# Patient Record
Sex: Female | Born: 1985 | Race: White | Hispanic: No | Marital: Married | Smoking: Current every day smoker
Health system: Southern US, Community
[De-identification: ages and names within clinical notes are randomized; demographics above are authoritative.]

## PROBLEM LIST (undated history)

## (undated) DIAGNOSIS — Z789 Other specified health status: Secondary | ICD-10-CM

---
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
## 2005-02-22 ENCOUNTER — Other Ambulatory Visit: Admission: RE | Admit: 2005-02-22 | Discharge: 2005-02-22 | Payer: Self-pay | Admitting: Obstetrics and Gynecology

## 2006-09-11 ENCOUNTER — Emergency Department (HOSPITAL_COMMUNITY): Admission: EM | Admit: 2006-09-11 | Discharge: 2006-09-11 | Payer: Self-pay | Admitting: Emergency Medicine

## 2006-11-29 ENCOUNTER — Ambulatory Visit (HOSPITAL_COMMUNITY): Admission: RE | Admit: 2006-11-29 | Discharge: 2006-11-29 | Payer: Self-pay | Admitting: Obstetrics and Gynecology

## 2006-12-18 ENCOUNTER — Ambulatory Visit (HOSPITAL_COMMUNITY): Admission: RE | Admit: 2006-12-18 | Discharge: 2006-12-18 | Payer: Self-pay | Admitting: Obstetrics and Gynecology

## 2007-04-07 ENCOUNTER — Inpatient Hospital Stay (HOSPITAL_COMMUNITY): Admission: AD | Admit: 2007-04-07 | Discharge: 2007-04-09 | Payer: Self-pay | Admitting: Obstetrics and Gynecology

## 2007-04-17 ENCOUNTER — Encounter: Admission: RE | Admit: 2007-04-17 | Discharge: 2007-05-16 | Payer: Self-pay | Admitting: Obstetrics and Gynecology

## 2007-05-17 ENCOUNTER — Encounter: Admission: RE | Admit: 2007-05-17 | Discharge: 2007-05-25 | Payer: Self-pay | Admitting: Obstetrics and Gynecology

## 2007-06-01 IMAGING — US US OB FOLLOW-UP
1 series · 14 of 28 positions shown · non-contrast
Comparison: none

OBSTETRICAL ULTRASOUND:
 This ultrasound was performed in The [HOSPITAL], and the [facility] report will be stored to [REDACTED] PACS.

[Series 1: us ob follow-up · 14 of 71 slices shown]
[im 3/71]
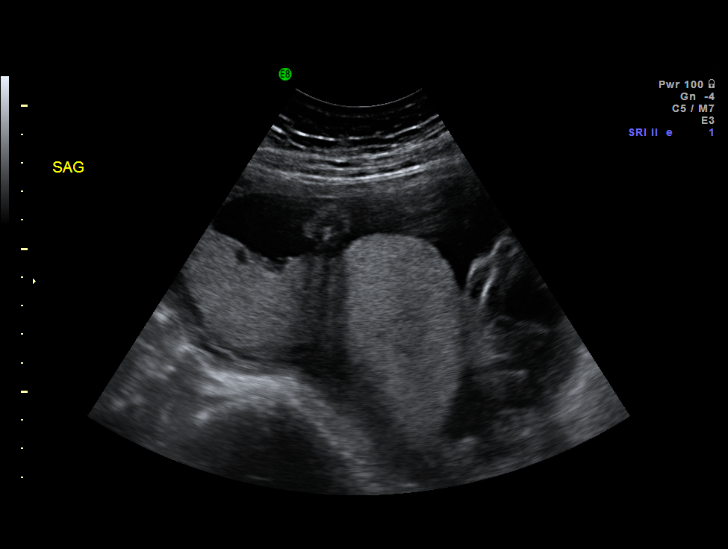
[im 8/71]
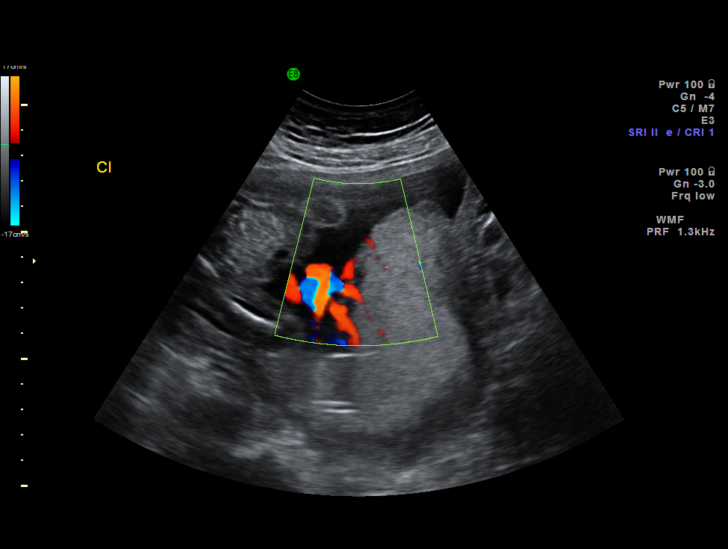
[im 13/71]
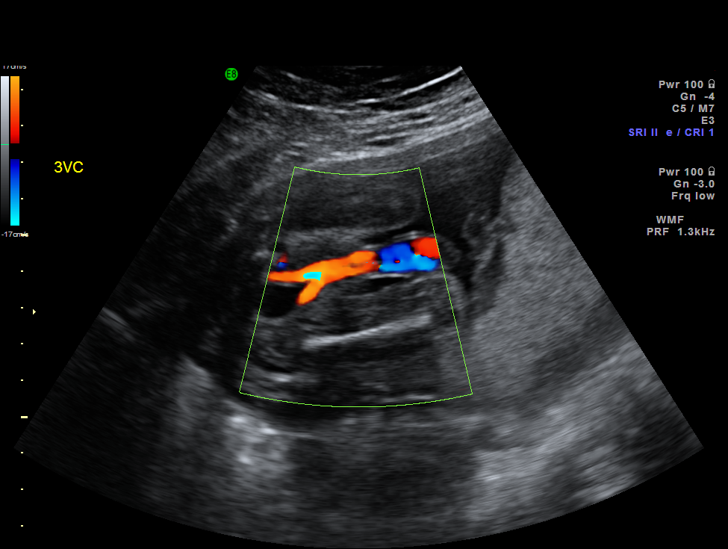
[im 19/71]
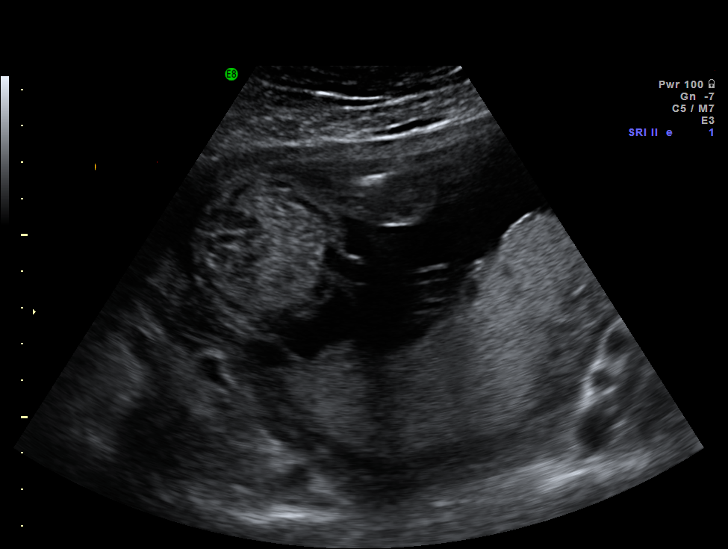
[im 24/71]
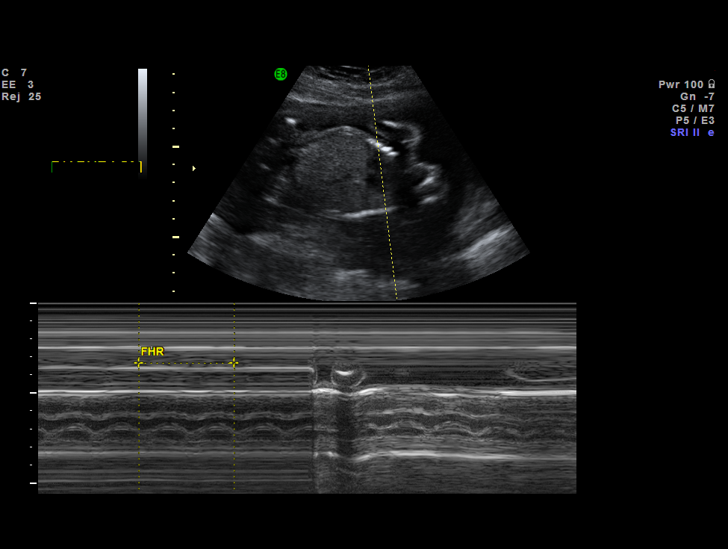
[im 29/71]
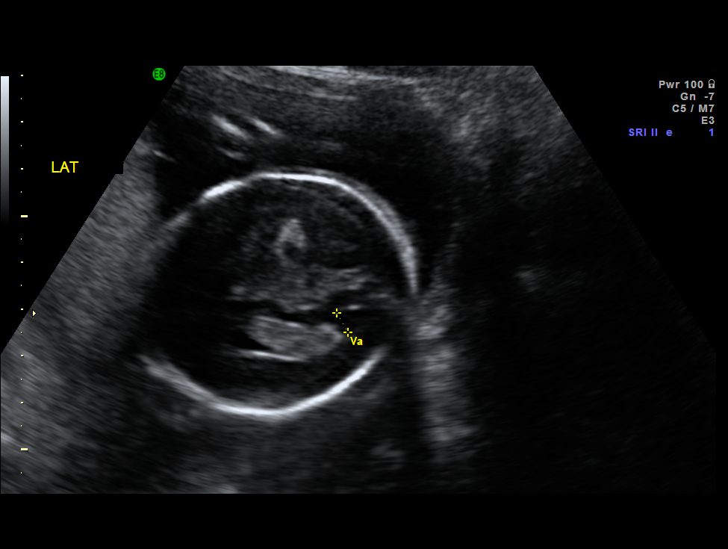
[im 34/71]
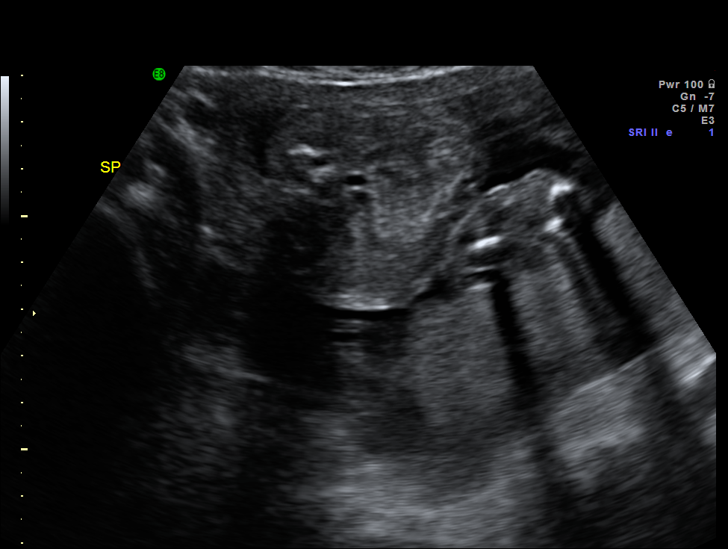
[im 39/71]
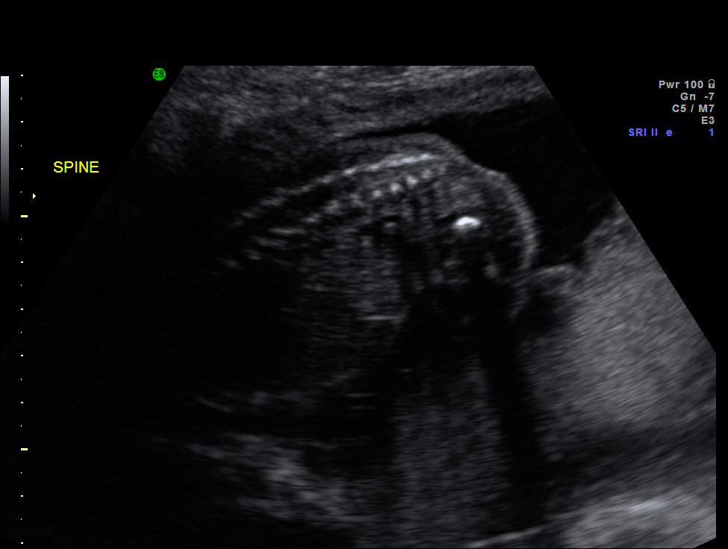
[im 45/71]
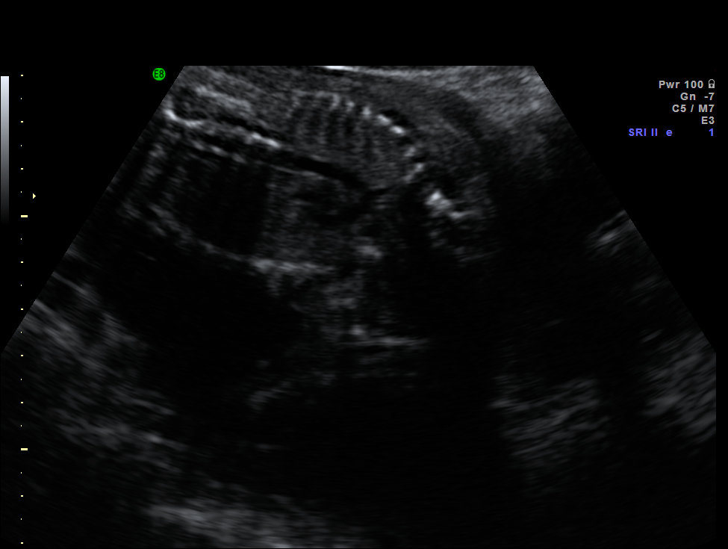
[im 50/71]
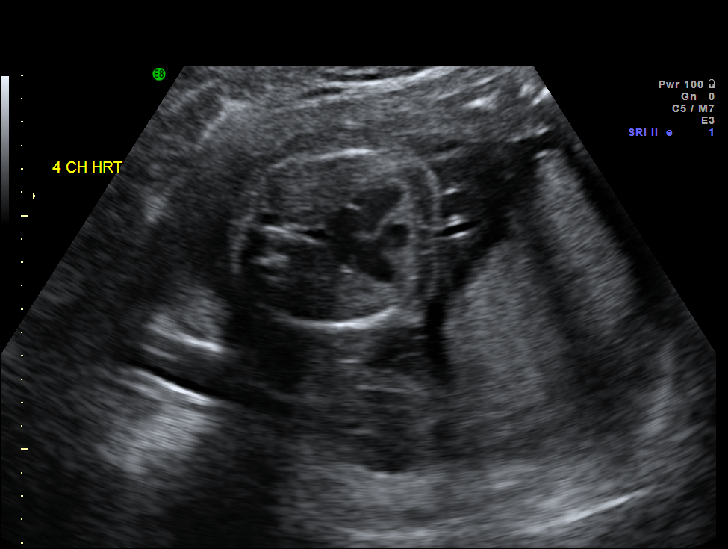
[im 55/71]
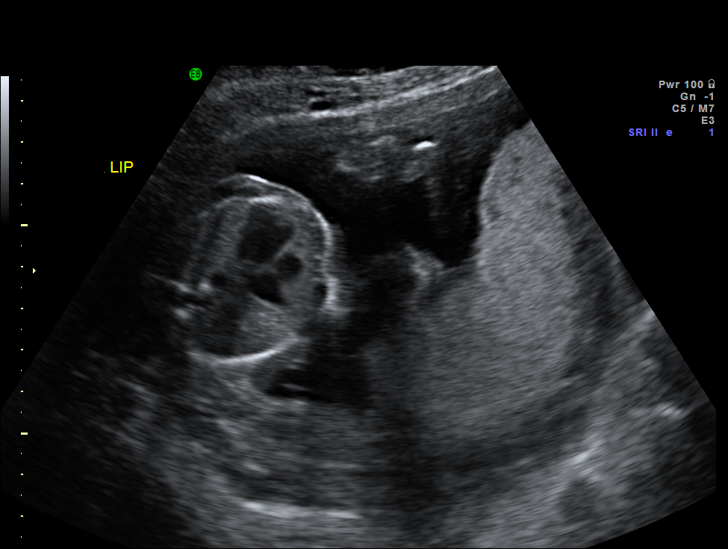
[im 60/71]
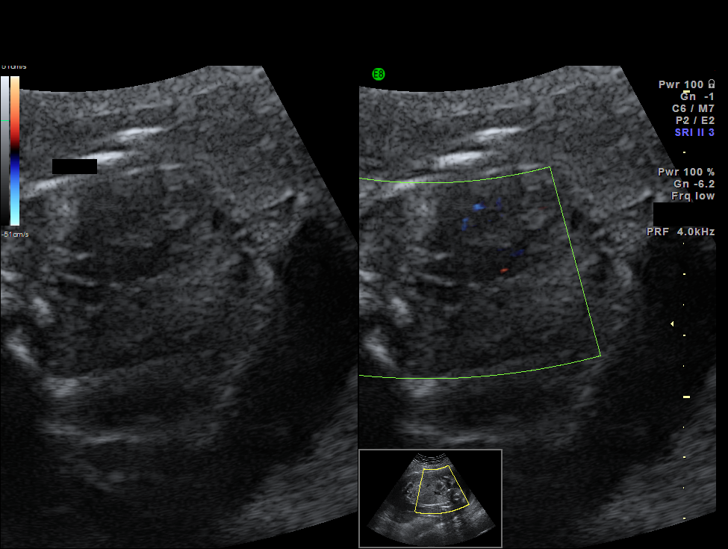
[im 65/71]
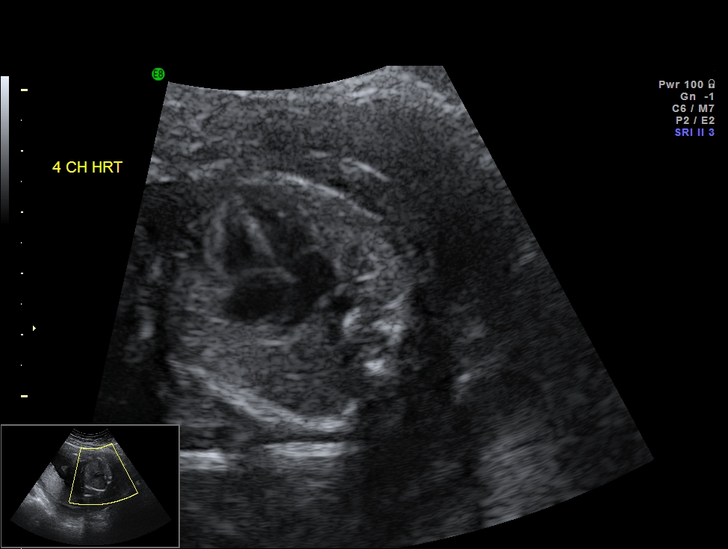
[im 71/71]
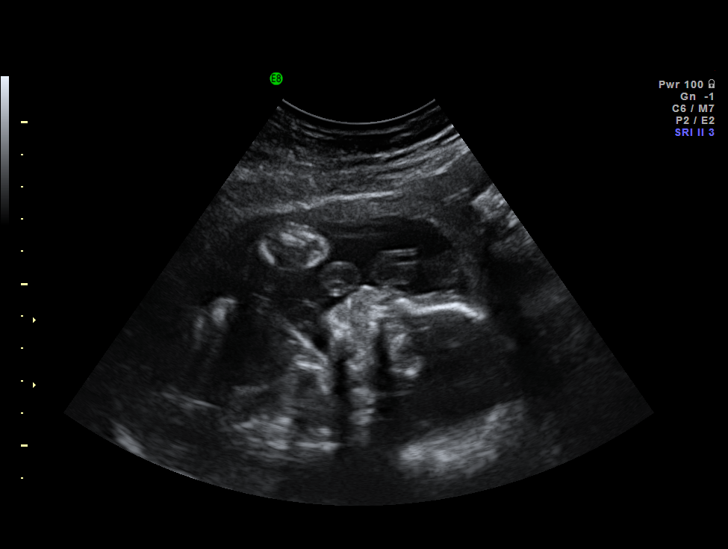

[14 of 28 positions shown; findings below may reference images not displayed]

IMPRESSION: The [facility] report has also been faxed to the ordering physician.

## 2007-06-24 ENCOUNTER — Ambulatory Visit (HOSPITAL_COMMUNITY): Admission: RE | Admit: 2007-06-24 | Discharge: 2007-06-24 | Payer: Self-pay | Admitting: Obstetrics and Gynecology

## 2007-11-25 ENCOUNTER — Ambulatory Visit (HOSPITAL_COMMUNITY): Admission: RE | Admit: 2007-11-25 | Discharge: 2007-11-25 | Payer: Self-pay | Admitting: Obstetrics and Gynecology

## 2007-11-25 ENCOUNTER — Emergency Department (HOSPITAL_COMMUNITY): Admission: EM | Admit: 2007-11-25 | Discharge: 2007-11-25 | Payer: Self-pay | Admitting: Emergency Medicine

## 2008-06-14 ENCOUNTER — Inpatient Hospital Stay (HOSPITAL_COMMUNITY): Admission: AD | Admit: 2008-06-14 | Discharge: 2008-06-16 | Payer: Self-pay | Admitting: Obstetrics and Gynecology

## 2009-08-07 HISTORY — PX: WISDOM TOOTH EXTRACTION: SHX21

## 2010-08-28 ENCOUNTER — Encounter: Payer: Self-pay | Admitting: Obstetrics and Gynecology

## 2011-05-02 LAB — URINE MICROSCOPIC-ADD ON

## 2011-05-02 LAB — POCT PREGNANCY, URINE
Operator id: 146091
Preg Test, Ur: POSITIVE

## 2011-05-02 LAB — POCT I-STAT, CHEM 8
BUN: 8
Calcium, Ion: 1.17
Chloride: 102
Glucose, Bld: 89
Potassium: 3.7
Sodium: 135

## 2011-05-02 LAB — URINALYSIS, ROUTINE W REFLEX MICROSCOPIC
Hgb urine dipstick: NEGATIVE
Ketones, ur: NEGATIVE
Nitrite: NEGATIVE
Protein, ur: NEGATIVE
Urobilinogen, UA: 1
pH: 6

## 2011-05-02 LAB — HCG, QUANTITATIVE, PREGNANCY: hCG, Beta Chain, Quant, S: 114012 — ABNORMAL HIGH

## 2011-05-09 LAB — CBC
HCT: 35.6 — ABNORMAL LOW
MCV: 85.1
MCV: 85.8
Platelets: 151
Platelets: 158
RBC: 3.57 — ABNORMAL LOW
RBC: 4.19
RDW: 12.9

## 2011-05-19 LAB — RPR: RPR Ser Ql: NONREACTIVE

## 2011-05-19 LAB — CBC
HCT: 32.5 — ABNORMAL LOW
HCT: 37.8
MCHC: 34.1
MCV: 86.9
MCV: 85.6
Platelets: 144 — ABNORMAL LOW
RBC: 3.74 — ABNORMAL LOW
RDW: 12.4
WBC: 16.6 — ABNORMAL HIGH

## 2013-09-29 ENCOUNTER — Ambulatory Visit (INDEPENDENT_AMBULATORY_CARE_PROVIDER_SITE_OTHER): Payer: Self-pay | Admitting: Family Medicine

## 2013-09-29 ENCOUNTER — Encounter: Payer: Self-pay | Admitting: Family Medicine

## 2013-09-29 ENCOUNTER — Encounter (INDEPENDENT_AMBULATORY_CARE_PROVIDER_SITE_OTHER): Payer: Self-pay

## 2013-09-29 VITALS — BP 95/62 | HR 88 | Temp 98.4°F | Ht 65.5 in | Wt 139.0 lb

## 2013-09-29 DIAGNOSIS — Z0000 Encounter for general adult medical examination without abnormal findings: Secondary | ICD-10-CM

## 2013-09-29 LAB — POCT CBC
Granulocyte percent: 63.2 %G (ref 37–80)
HCT, POC: 39.9 % (ref 37.7–47.9)
Hemoglobin: 12.5 g/dL (ref 12.2–16.2)
Lymph, poc: 3.1 (ref 0.6–3.4)
MCH, POC: 26.5 pg — AB (ref 27–31.2)
MCHC: 31.3 g/dL — AB (ref 31.8–35.4)
MCV: 84.5 fL (ref 80–97)
MPV: 7.7 fL (ref 0–99.8)
POC Granulocyte: 6.4 (ref 2–6.9)
POC LYMPH PERCENT: 30.9 %L (ref 10–50)
Platelet Count, POC: 198 10*3/uL (ref 142–424)
RBC: 4.7 M/uL (ref 4.04–5.48)
RDW, POC: 14.4 %
WBC: 10.1 10*3/uL (ref 4.6–10.2)

## 2013-09-29 NOTE — Progress Notes (Signed)
   Subjective:    Patient ID: MONIA TIMMERS, female    DOB: 02-Dec-1985, 28 y.o.   MRN: [id number]  HPI This 28 y.o. female presents for evaluation of routine follow up.  She is due for annual labs.  She is scheduled for pap smear.  She does smoke cigarettes.   Review of Systems No chest pain, SOB, HA, dizziness, vision change, N/V, diarrhea, constipation, dysuria, urinary urgency or frequency, myalgias, arthralgias or rash.     Objective:   Physical Exam Vital signs noted  Well developed well nourished female.  HEENT - Head atraumatic Normocephalic                Eyes - PERRLA, Conjuctiva - clear Sclera- Clear EOMI                Ears - EAC's Wnl TM's Wnl Gross Hearing WNL                Nose - Nares patent                 Throat - oropharanx wnl Respiratory - Lungs CTA bilateral Cardiac - RRR S1 and S2 without murmur GI - Abdomen soft Nontender and bowel sounds active x 4 Extremities - No edema. Neuro - Grossly intact.       Assessment & Plan:  Routine general medical examination at a health care facility - Plan: POCT CBC, CMP14+EGFR, Lipid panel, TSH, Vit D  25 hydroxy (rtn osteoporosis monitoring).  Follow up for pap smear  Tobacco abuse - Discussed that she needs to quit and she doesn't want help at this time  Lysbeth Penner FNP

## 2013-09-30 ENCOUNTER — Other Ambulatory Visit: Payer: Self-pay | Admitting: Family Medicine

## 2013-09-30 LAB — CMP14+EGFR
ALT: 5 IU/L (ref 0–32)
AST: 14 IU/L (ref 0–40)
Albumin/Globulin Ratio: 2.1 (ref 1.1–2.5)
Albumin: 4.5 g/dL (ref 3.5–5.5)
Alkaline Phosphatase: 93 IU/L (ref 39–117)
BUN/Creatinine Ratio: 13 (ref 8–20)
BUN: 9 mg/dL (ref 6–20)
CO2: 25 mmol/L (ref 18–29)
Calcium: 9.1 mg/dL (ref 8.7–10.2)
Chloride: 101 mmol/L (ref 97–108)
Creatinine, Ser: 0.7 mg/dL (ref 0.57–1.00)
GFR calc Af Amer: 136 mL/min/{1.73_m2} (ref >59)
GFR calc non Af Amer: 118 mL/min/{1.73_m2} (ref >59)
Globulin, Total: 2.1 g/dL (ref 1.5–4.5)
Glucose: 82 mg/dL (ref 65–99)
Potassium: 4.2 mmol/L (ref 3.5–5.2)
Sodium: 140 mmol/L (ref 134–144)
Total Bilirubin: <0.2 mg/dL (ref 0.0–1.2)
Total Protein: 6.6 g/dL (ref 6.0–8.5)

## 2013-09-30 LAB — LIPID PANEL
Chol/HDL Ratio: 4.5 ratio units — ABNORMAL HIGH (ref 0.0–4.4)
Cholesterol, Total: 180 mg/dL (ref 100–199)
HDL: 40 mg/dL (ref >39)
LDL Calculated: 130 mg/dL — ABNORMAL HIGH (ref 0–99)
Triglycerides: 51 mg/dL (ref 0–149)
VLDL Cholesterol Cal: 10 mg/dL (ref 5–40)

## 2013-09-30 LAB — VITAMIN D 25 HYDROXY (VIT D DEFICIENCY, FRACTURES): Vit D, 25-Hydroxy: 12.8 ng/mL — ABNORMAL LOW (ref 30.0–100.0)

## 2013-09-30 LAB — TSH: TSH: 2.82 u[IU]/mL (ref 0.450–4.500)

## 2013-09-30 MED ORDER — VITAMIN D (ERGOCALCIFEROL) 1.25 MG (50000 UNIT) PO CAPS: 50000.0000 [IU] | ORAL_CAPSULE | ORAL | Status: DC

## 2015-05-20 LAB — OB RESULTS CONSOLE GC/CHLAMYDIA
CHLAMYDIA, DNA PROBE: NEGATIVE
GC PROBE AMP, GENITAL: NEGATIVE

## 2015-06-17 LAB — OB RESULTS CONSOLE ABO/RH: RH Type: POSITIVE

## 2015-06-17 LAB — OB RESULTS CONSOLE HEPATITIS B SURFACE ANTIGEN: HEP B S AG: NEGATIVE

## 2015-06-17 LAB — OB RESULTS CONSOLE ANTIBODY SCREEN: ANTIBODY SCREEN: NEGATIVE

## 2015-06-17 LAB — OB RESULTS CONSOLE HIV ANTIBODY (ROUTINE TESTING): HIV: NONREACTIVE

## 2015-06-17 LAB — OB RESULTS CONSOLE RPR: RPR: NONREACTIVE

## 2015-08-08 NOTE — L&D Delivery Note (Signed)
Delivery Note Pt never felt pressure or pain, but baby was at + 2 station, so she decided to push.  After 3 pushes, At 1:48 AM a viable female was delivered via Vaginal, Spontaneous Delivery (Presentation: Right Occiput Anterior).  APGAR:9/9 , ; weight pending. After 2 minutes, the cord was clamped and cut. 40 units of pitocin diluted in 1000cc LR was infused rapidly IV.  The placenta separated spontaneously and delivered via CCT and maternal pushing effort.  It was inspected and appears to be intact with a 3 VC.   Marland Kitchen.     Anesthesia: Epidural  Episiotomy: None Lacerations:  2bd degree Suture Repair: 2.0 vicryl Est. Blood Loss (mL):  200  Mom to postpartum.  Baby to Couplet care / Skin to Skin.  CRESENZO-DISHMAN,Jahmeir Geisen 12/17/2015, 2:15 AM

## 2015-12-16 ENCOUNTER — Inpatient Hospital Stay (HOSPITAL_COMMUNITY)
Admission: AD | Admit: 2015-12-16 | Discharge: 2015-12-18 | DRG: 775 | Disposition: A | Discharge status: Home / Self Care | Payer: Medicaid Other | Source: Ambulatory Visit | Attending: Obstetrics and Gynecology | Admitting: Obstetrics and Gynecology

## 2015-12-16 ENCOUNTER — Inpatient Hospital Stay (HOSPITAL_COMMUNITY): Payer: Medicaid Other | Admitting: Anesthesiology

## 2015-12-16 ENCOUNTER — Encounter (HOSPITAL_COMMUNITY): Payer: Self-pay | Admitting: *Deleted

## 2015-12-16 DIAGNOSIS — Z833 Family history of diabetes mellitus: Secondary | ICD-10-CM

## 2015-12-16 DIAGNOSIS — Z3A38 38 weeks gestation of pregnancy: Secondary | ICD-10-CM | POA: Diagnosis not present

## 2015-12-16 DIAGNOSIS — O99334 Smoking (tobacco) complicating childbirth: Secondary | ICD-10-CM | POA: Diagnosis present

## 2015-12-16 DIAGNOSIS — F17210 Nicotine dependence, cigarettes, uncomplicated: Secondary | ICD-10-CM | POA: Diagnosis present

## 2015-12-16 DIAGNOSIS — IMO0001 Reserved for inherently not codable concepts without codable children: Secondary | ICD-10-CM

## 2015-12-16 HISTORY — DX: Other specified health status: Z78.9

## 2015-12-16 LAB — URINE MICROSCOPIC-ADD ON
Bacteria, UA: NONE SEEN
RBC / HPF: NONE SEEN RBC/hpf (ref 0–5)
WBC, UA: NONE SEEN WBC/hpf (ref 0–5)

## 2015-12-16 LAB — CBC
HCT: 33.1 % — ABNORMAL LOW (ref 36.0–46.0)
Hemoglobin: 11.2 g/dL — ABNORMAL LOW (ref 12.0–15.0)
MCH: 28.9 pg (ref 26.0–34.0)
MCHC: 33.8 g/dL (ref 30.0–36.0)
MCV: 85.3 fL (ref 78.0–100.0)
PLATELETS: 252 10*3/uL (ref 150–400)
RBC: 3.88 MIL/uL (ref 3.87–5.11)
RDW: 13.6 % (ref 11.5–15.5)
WBC: 13.7 10*3/uL — ABNORMAL HIGH (ref 4.0–10.5)

## 2015-12-16 LAB — URINALYSIS, ROUTINE W REFLEX MICROSCOPIC
BILIRUBIN URINE: NEGATIVE
GLUCOSE, UA: 500 mg/dL — AB
KETONES UR: NEGATIVE mg/dL
LEUKOCYTES UA: NEGATIVE
NITRITE: NEGATIVE
PH: 5.5 (ref 5.0–8.0)
Protein, ur: NEGATIVE mg/dL
SPECIFIC GRAVITY, URINE: 1.01 (ref 1.005–1.030)

## 2015-12-16 LAB — COMPREHENSIVE METABOLIC PANEL
ALT: 11 U/L — AB (ref 14–54)
ANION GAP: 10 (ref 5–15)
AST: 19 U/L (ref 15–41)
Albumin: 2.9 g/dL — ABNORMAL LOW (ref 3.5–5.0)
Alkaline Phosphatase: 184 U/L — ABNORMAL HIGH (ref 38–126)
BUN: 10 mg/dL (ref 6–20)
CHLORIDE: 102 mmol/L (ref 101–111)
CO2: 22 mmol/L (ref 22–32)
CREATININE: 0.61 mg/dL (ref 0.44–1.00)
Calcium: 8.7 mg/dL — ABNORMAL LOW (ref 8.9–10.3)
GFR calc Af Amer: >60 mL/min (ref >60)
Glucose, Bld: 93 mg/dL (ref 65–99)
POTASSIUM: 3.8 mmol/L (ref 3.5–5.1)
Sodium: 134 mmol/L — ABNORMAL LOW (ref 135–145)
Total Bilirubin: 0.3 mg/dL (ref 0.3–1.2)
Total Protein: 6.8 g/dL (ref 6.5–8.1)

## 2015-12-16 LAB — RAPID URINE DRUG SCREEN, HOSP PERFORMED
Amphetamines: NOT DETECTED
BARBITURATES: NOT DETECTED
BENZODIAZEPINES: NOT DETECTED
COCAINE: NOT DETECTED
Opiates: NOT DETECTED
TETRAHYDROCANNABINOL: NOT DETECTED

## 2015-12-16 LAB — PROTEIN / CREATININE RATIO, URINE
CREATININE, URINE: 41 mg/dL
PROTEIN CREATININE RATIO: 0.17 mg/mg{creat} — AB (ref 0.00–0.15)
TOTAL PROTEIN, URINE: 7 mg/dL

## 2015-12-16 LAB — GROUP B STREP BY PCR: Group B strep by PCR: NEGATIVE

## 2015-12-16 LAB — TYPE AND SCREEN
ABO/RH(D): O POS
Antibody Screen: NEGATIVE

## 2015-12-16 LAB — OB RESULTS CONSOLE GBS: STREP GROUP B AG: NEGATIVE

## 2015-12-16 MED ORDER — ONDANSETRON HCL 4 MG/2ML IJ SOLN: 4.0000 mg | Freq: Four times a day (QID) | INTRAMUSCULAR | Status: DC | PRN

## 2015-12-16 MED ORDER — PHENYLEPHRINE 40 MCG/ML (10ML) SYRINGE FOR IV PUSH (FOR BLOOD PRESSURE SUPPORT): 80.0000 ug | PREFILLED_SYRINGE | INTRAVENOUS | Status: DC | PRN

## 2015-12-16 MED ORDER — LIDOCAINE HCL (PF) 1 % IJ SOLN
30.0000 mL | INTRAMUSCULAR | Status: DC | PRN
  Filled 2015-12-16: qty 30

## 2015-12-16 MED ORDER — EPHEDRINE 5 MG/ML INJ
10.0000 mg | INTRAVENOUS | Status: DC | PRN
  Filled 2015-12-16: qty 2

## 2015-12-16 MED ORDER — DIPHENHYDRAMINE HCL 50 MG/ML IJ SOLN: 12.5000 mg | INTRAMUSCULAR | Status: DC | PRN

## 2015-12-16 MED ORDER — LACTATED RINGERS IV SOLN
INTRAVENOUS | Status: DC
  Administered 2015-12-16 (×3): via INTRAVENOUS

## 2015-12-16 MED ORDER — LACTATED RINGERS IV SOLN: 500.0000 mL | Freq: Once | INTRAVENOUS | Status: DC

## 2015-12-16 MED ORDER — EPHEDRINE 5 MG/ML INJ: 10.0000 mg | INTRAVENOUS | Status: DC | PRN

## 2015-12-16 MED ORDER — CITRIC ACID-SODIUM CITRATE 334-500 MG/5ML PO SOLN: 30.0000 mL | ORAL | Status: DC | PRN

## 2015-12-16 MED ORDER — OXYTOCIN 40 UNITS IN LACTATED RINGERS INFUSION - SIMPLE MED
2.5000 [IU]/h | INTRAVENOUS | Status: DC
  Filled 2015-12-16: qty 1000

## 2015-12-16 MED ORDER — OXYCODONE-ACETAMINOPHEN 5-325 MG PO TABS: 2.0000 | ORAL_TABLET | ORAL | Status: DC | PRN

## 2015-12-16 MED ORDER — LIDOCAINE HCL (PF) 1 % IJ SOLN
INTRAMUSCULAR | Status: DC | PRN
  Administered 2015-12-16 (×2): 6 mL via EPIDURAL

## 2015-12-16 MED ORDER — OXYCODONE-ACETAMINOPHEN 5-325 MG PO TABS: 1.0000 | ORAL_TABLET | ORAL | Status: DC | PRN

## 2015-12-16 MED ORDER — PHENYLEPHRINE 40 MCG/ML (10ML) SYRINGE FOR IV PUSH (FOR BLOOD PRESSURE SUPPORT)
80.0000 ug | PREFILLED_SYRINGE | INTRAVENOUS | Status: DC | PRN
  Filled 2015-12-16: qty 10
  Filled 2015-12-16: qty 5

## 2015-12-16 MED ORDER — FENTANYL 2.5 MCG/ML BUPIVACAINE 1/10 % EPIDURAL INFUSION (WH - ANES)
14.0000 mL/h | INTRAMUSCULAR | Status: DC | PRN
Start: 2015-12-16 — End: 2015-12-17
  Administered 2015-12-16 (×2): 14 mL/h via EPIDURAL
  Filled 2015-12-16: qty 125

## 2015-12-16 MED ORDER — LACTATED RINGERS IV SOLN
500.0000 mL | Freq: Once | INTRAVENOUS | Status: AC
  Administered 2015-12-16: 500 mL via INTRAVENOUS

## 2015-12-16 MED ORDER — OXYTOCIN BOLUS FROM INFUSION
500.0000 mL | INTRAVENOUS | Status: DC
  Administered 2015-12-17: 500 mL via INTRAVENOUS

## 2015-12-16 MED ORDER — PHENYLEPHRINE 40 MCG/ML (10ML) SYRINGE FOR IV PUSH (FOR BLOOD PRESSURE SUPPORT)
80.0000 ug | PREFILLED_SYRINGE | INTRAVENOUS | Status: DC | PRN
Start: 2015-12-16 — End: 2015-12-17

## 2015-12-16 MED ORDER — ACETAMINOPHEN 325 MG PO TABS: 650.0000 mg | ORAL_TABLET | ORAL | Status: DC | PRN

## 2015-12-16 MED ORDER — PHENYLEPHRINE 40 MCG/ML (10ML) SYRINGE FOR IV PUSH (FOR BLOOD PRESSURE SUPPORT)
80.0000 ug | PREFILLED_SYRINGE | INTRAVENOUS | Status: DC | PRN
  Filled 2015-12-16: qty 5

## 2015-12-16 MED ORDER — LACTATED RINGERS IV SOLN: 500.0000 mL | INTRAVENOUS | Status: DC | PRN

## 2015-12-16 NOTE — Anesthesia Procedure Notes (Signed)
Epidural Patient location during procedure: OB Start time: 12/16/2015 10:57 PM End time: 12/16/2015 11:11 PM  Staffing Anesthesiologist: Jairo BenJACKSON, Angelli Baruch Performed by: anesthesiologist   Preanesthetic Checklist Completed: patient identified, surgical consent, pre-op evaluation, timeout performed, IV checked, risks and benefits discussed and monitors and equipment checked  Epidural Patient position: sitting Prep: ChloraPrep and site prepped and draped Patient monitoring: heart rate, continuous pulse ox and blood pressure Approach: midline Location: L3-L4 Injection technique: LOR air  Needle:  Needle type: Tuohy  Needle gauge: 17 G Needle length: 9 cm Needle insertion depth: 7 cm Catheter type: closed end flexible Catheter size: 19 Gauge Catheter at skin depth: 12 cm Test dose: negative and Other (1% lidocaine)  Additional Notes LOR 7cm. Cath 12cm skin  Pt identified in Labor room.  Monitors applied. Working IV access confirmed. Sterile prep, drape lumbar spine.  1% lido local L 3,4.  #17ga Touhy LOR air 7 cm, cath in easily 6 cm, pulled back to 12 cm skin.  1% lido test OK, dosed, and infusion begun.  Patient asymptomatic, VSS, no heme aspirated, tolerated well.  Sandford Craze Demetric Parslow, MD Reason for block:procedure for pain

## 2015-12-16 NOTE — H&P (Signed)
Angelica Duncan is a 30 y.o. female [phone] @[redacted]w[redacted]d  by LMP c/w early US presenting for pelvic pressure and concerns that she has not been in prenatal care since 20 weeks. She [facility] but lost her insurance so was unable to continue care.  She has hx of 3 previous NSVDs at term and no hx with this or previous pregnancies of GDM or HTN.  She reports good fetal movement, denies LOF, vaginal bleeding, vaginal itching/burning, urinary symptoms, h/a, dizziness, n/v, or fever/chills.    Records available and printed by [facility], need to be scanned into electronic record.  Maternal Medical History:  Reason for admission: Contractions.  Nausea.  Contractions: Onset was more than 2 days ago.   Frequency: irregular.   Perceived severity is moderate.    Fetal activity: Perceived fetal activity is normal.   Last perceived fetal movement was within the past hour.    Prenatal complications: Limited prenatal care  Prenatal Complications - Diabetes: none.    OB History    Gravida Para Term Preterm AB TAB SAB Ectopic Multiple Living   4 3 3       3      Past Medical History  Diagnosis Date  . Medical history non-contributory    Past Surgical History  Procedure Laterality Date  . Wisdom tooth extraction  2011   Family History: family history includes Diabetes in her mother; Hyperlipidemia in her mother. Social History:  reports that she has been smoking Cigarettes.  She started smoking about 7 years ago. She has been smoking about 0.50 packs per day. She does not have any smokeless tobacco history on file. She reports that she does not drink alcohol or use illicit drugs.   Prenatal Transfer Tool  Maternal Diabetes: No Genetic Screening: Normal Maternal Ultrasounds/Referrals: Normal Fetal Ultrasounds or other Referrals:  None Maternal Substance Abuse:  No Significant Maternal Medications:  None Significant Maternal Lab Results:  Lab values  include: Other:  Other Comments:  GBS Unknown, rapid test negative on admission  Review of Systems  Constitutional: Negative for fever, chills and malaise/fatigue.  Eyes: Negative for blurred vision.  Respiratory: Negative for cough and shortness of breath.   Cardiovascular: Negative for chest pain.  Gastrointestinal: Negative for heartburn, nausea and vomiting.  Genitourinary: Negative for dysuria, urgency and frequency.  Musculoskeletal: Negative.   Neurological: Negative for dizziness and headaches.  Psychiatric/Behavioral: Negative for depression.    Dilation: 6 Effacement (%): 80 Station: -2 Exam by:: L. Leftwich-Kirbby, CNM Blood pressure 122/81, pulse 103, temperature 98.2 F (36.8 C), temperature source Oral, resp. rate 18, height 5\' 6"  (1.676 m), weight 182 lb (82.555 kg), last menstrual period 03/22/2015. Maternal Exam:  Uterine Assessment: Contraction strength is mild.  Contraction duration is 80 seconds. Contraction frequency is irregular.   Abdomen: Fetal presentation: vertex  Pelvis: adequate for delivery.   Cervix: Cervix evaluated by digital exam.     Fetal Exam Fetal Monitor Review: Mode: ultrasound.   Baseline rate: 135.  Pattern: accelerations present.    Fetal State Assessment: Category I - tracings are normal.     Physical Exam  Nursing note and vitals reviewed. Constitutional: She is oriented to person, place, and time. She appears well-developed and well-nourished.  Neck: Normal range of motion.  Cardiovascular: Normal rate and regular rhythm.   Respiratory: Effort normal and breath sounds normal.  GI: Soft.  Musculoskeletal: Normal range of motion.  Neurological: She is alert and oriented to person,  place, and time.  Skin: Skin is warm and dry.  Psychiatric: She has a normal mood and affect. Her behavior is normal. Judgment and thought content normal.    Prenatal labs: ABO, Rh:   Antibody:   Rubella:   RPR:    HBsAg:    HIV:    GBS:  Negative (05/11 0000)   Assessment/Plan: [id number]  by LMP/early [place] 1. Active labor at term     Admit to [facility] May have epidural when desired Anticipate NSVD  LEFTWICH-KIRBY, Baylynn Shifflett 12/16/2015, 8:01 PM

## 2015-12-16 NOTE — MAU Note (Signed)
Pt stated she has not been to the doctor since she was [redacted] weeks pregnant.Lost her insurance. Has medacaid now. Stated she is worried since she i 4039 week she has had all her kids around 37 week. Stated she has been feeling pressure and the "baby pushing" dow off and on for the past 2 weeks. Good fetal movement reported.

## 2015-12-16 NOTE — Anesthesia Preprocedure Evaluation (Addendum)
Anesthesia Evaluation  Patient identified by MRN, date of birth, ID band Patient awake    Reviewed: Allergy & Precautions, NPO status , Patient's Chart, lab work & pertinent test results  History of Anesthesia Complications Negative for: history of anesthetic complications  Airway Mallampati: II  TM Distance: >3 FB Neck ROM: Full    Dental  (+) Chipped, Dental Advisory Given   Pulmonary Current Smoker,    breath sounds clear to auscultation       Cardiovascular negative cardio ROS   Rhythm:Regular Rate:Normal     Neuro/Psych negative neurological ROS     GI/Hepatic negative GI ROS, Neg liver ROS,   Endo/Other  negative endocrine ROS  Renal/GU negative Renal ROS     Musculoskeletal   Abdominal   Peds  Hematology plt 252K    Anesthesia Other Findings   Reproductive/Obstetrics (+) Pregnancy                            Anesthesia Physical Anesthesia Plan  ASA: II  Anesthesia Plan: Epidural   Post-op Pain Management:    Induction:   Airway Management Planned: Natural Airway  Additional Equipment:   Intra-op Plan:   Post-operative Plan:   Informed Consent: I have reviewed the patients History and Physical, chart, labs and discussed the procedure including the risks, benefits and alternatives for the proposed anesthesia with the patient or authorized representative who has indicated his/her understanding and acceptance.   Dental advisory given  Plan Discussed with: CRNA and Surgeon  Anesthesia Plan Comments: (Patient identified. Risks/Benefits/Options discussed with patient including but not limited to bleeding, infection, nerve damage, paralysis, failed block, incomplete pain control, headache, blood pressure changes, nausea, vomiting, reactions to medication both or allergic, itching and postpartum back pain. Confirmed with bedside nurse the patient's most recent platelet count.  Confirmed with patient that they are not currently taking any anticoagulation, have any bleeding history or any family history of bleeding disorders. Patient expressed understanding and wished to proceed. All questions were answered.  )        Anesthesia Quick Evaluation

## 2015-12-17 ENCOUNTER — Encounter (HOSPITAL_COMMUNITY): Payer: Self-pay | Admitting: *Deleted

## 2015-12-17 DIAGNOSIS — Z3A38 38 weeks gestation of pregnancy: Secondary | ICD-10-CM

## 2015-12-17 DIAGNOSIS — O99334 Smoking (tobacco) complicating childbirth: Secondary | ICD-10-CM

## 2015-12-17 DIAGNOSIS — Z833 Family history of diabetes mellitus: Secondary | ICD-10-CM

## 2015-12-17 LAB — RPR: RPR Ser Ql: NONREACTIVE

## 2015-12-17 LAB — GC/CHLAMYDIA PROBE AMP (~~LOC~~) NOT AT ARMC
Chlamydia: NEGATIVE
Neisseria Gonorrhea: NEGATIVE

## 2015-12-17 LAB — HIV ANTIBODY (ROUTINE TESTING W REFLEX): HIV Screen 4th Generation wRfx: NONREACTIVE

## 2015-12-17 LAB — ABO/RH: ABO/RH(D): O POS

## 2015-12-17 MED ORDER — OXYCODONE HCL 5 MG PO TABS
10.0000 mg | ORAL_TABLET | ORAL | Status: DC | PRN
Start: 2015-12-17 — End: 2015-12-18

## 2015-12-17 MED ORDER — ACETAMINOPHEN 325 MG PO TABS: 650.0000 mg | ORAL_TABLET | ORAL | Status: DC | PRN

## 2015-12-17 MED ORDER — BISACODYL 10 MG RE SUPP: 10.0000 mg | Freq: Every day | RECTAL | Status: DC | PRN

## 2015-12-17 MED ORDER — SIMETHICONE 80 MG PO CHEW: 80.0000 mg | CHEWABLE_TABLET | ORAL | Status: DC | PRN

## 2015-12-17 MED ORDER — DIBUCAINE 1 % RE OINT: 1.0000 "application " | TOPICAL_OINTMENT | RECTAL | Status: DC | PRN

## 2015-12-17 MED ORDER — ONDANSETRON HCL 4 MG PO TABS: 4.0000 mg | ORAL_TABLET | ORAL | Status: DC | PRN

## 2015-12-17 MED ORDER — IBUPROFEN 600 MG PO TABS
600.0000 mg | ORAL_TABLET | Freq: Four times a day (QID) | ORAL | Status: DC
  Administered 2015-12-17 – 2015-12-18 (×4): 600 mg via ORAL
  Filled 2015-12-17 (×4): qty 1

## 2015-12-17 MED ORDER — WITCH HAZEL-GLYCERIN EX PADS: 1.0000 "application " | MEDICATED_PAD | CUTANEOUS | Status: DC | PRN

## 2015-12-17 MED ORDER — TETANUS-DIPHTH-ACELL PERTUSSIS 5-2.5-18.5 LF-MCG/0.5 IM SUSP
0.5000 mL | Freq: Once | INTRAMUSCULAR | Status: AC
  Administered 2015-12-18: 0.5 mL via INTRAMUSCULAR

## 2015-12-17 MED ORDER — ONDANSETRON HCL 4 MG/2ML IJ SOLN: 4.0000 mg | INTRAMUSCULAR | Status: DC | PRN

## 2015-12-17 MED ORDER — COCONUT OIL OIL
1.0000 "application " | TOPICAL_OIL | Status: DC | PRN
  Filled 2015-12-17: qty 120

## 2015-12-17 MED ORDER — BENZOCAINE-MENTHOL 20-0.5 % EX AERO: 1.0000 "application " | INHALATION_SPRAY | CUTANEOUS | Status: DC | PRN

## 2015-12-17 MED ORDER — FLEET ENEMA 7-19 GM/118ML RE ENEM: 1.0000 | ENEMA | Freq: Every day | RECTAL | Status: DC | PRN

## 2015-12-17 MED ORDER — MEASLES, MUMPS & RUBELLA VAC ~~LOC~~ INJ
0.5000 mL | INJECTION | Freq: Once | SUBCUTANEOUS | Status: DC
  Filled 2015-12-17: qty 0.5

## 2015-12-17 MED ORDER — DOCUSATE SODIUM 100 MG PO CAPS
100.0000 mg | ORAL_CAPSULE | Freq: Two times a day (BID) | ORAL | Status: DC
  Administered 2015-12-17 – 2015-12-18 (×2): 100 mg via ORAL
  Filled 2015-12-17 (×2): qty 1

## 2015-12-17 MED ORDER — OXYCODONE HCL 5 MG PO TABS: 5.0000 mg | ORAL_TABLET | ORAL | Status: DC | PRN

## 2015-12-17 MED ORDER — PRENATAL MULTIVITAMIN CH
1.0000 | ORAL_TABLET | Freq: Every day | ORAL | Status: DC
  Administered 2015-12-17 – 2015-12-18 (×2): 1 via ORAL
  Filled 2015-12-17 (×2): qty 1

## 2015-12-17 MED ORDER — DIPHENHYDRAMINE HCL 25 MG PO CAPS
25.0000 mg | ORAL_CAPSULE | Freq: Four times a day (QID) | ORAL | Status: DC | PRN
Start: 2015-12-17 — End: 2015-12-18

## 2015-12-17 MED ORDER — METHYLERGONOVINE MALEATE 0.2 MG/ML IJ SOLN: 0.2000 mg | INTRAMUSCULAR | Status: DC | PRN

## 2015-12-17 MED ORDER — FERROUS SULFATE 325 (65 FE) MG PO TABS
325.0000 mg | ORAL_TABLET | Freq: Two times a day (BID) | ORAL | Status: DC
  Administered 2015-12-17 – 2015-12-18 (×3): 325 mg via ORAL
  Filled 2015-12-17 (×3): qty 1

## 2015-12-17 MED ORDER — METHYLERGONOVINE MALEATE 0.2 MG PO TABS: 0.2000 mg | ORAL_TABLET | ORAL | Status: DC | PRN

## 2015-12-17 MED ORDER — ZOLPIDEM TARTRATE 5 MG PO TABS
5.0000 mg | ORAL_TABLET | Freq: Every evening | ORAL | Status: DC | PRN
Start: 2015-12-17 — End: 2015-12-18

## 2015-12-17 NOTE — Progress Notes (Signed)
CSW acknowledges consult for limited PNC.  CSW notes documentation stating that MOB had regular care until 20 weeks when she lost her insurance, which does not meet criteria for automatic CSW consult.  Please call CSW if current concerns arise or by MOB's request.    

## 2015-12-17 NOTE — Lactation Note (Signed)
This note was copied from a baby's chart. Lactation Consultation Note  Patient Name: Angelica Duncan Today's Date: 12/17/2015 Reason for consult: Initial assessment Baby at 16 hr of life and mom reports baby is latching better than any of her other children. She stated the baby was pinching at each feedings but that has gotten better. Discussed baby behavior, feeding frequency, baby belly size, voids, wt loss, breast changes, and nipple care. Demonstrated manual expression, colostrum noted bilaterally, spoon in room. Given lactation handouts. Aware of OP services and support group.    Maternal Data Has patient been taught Hand Expression?: Yes Does the patient have breastfeeding experience prior to this delivery?: No  Feeding    LATCH Score/Interventions                      Lactation Tools Discussed/Used WIC Program: No   Consult Status Consult Status: Follow-up Date: 12/18/15 Follow-up type: In-patient    [name] 12/17/2015, 5:55 PM

## 2015-12-17 NOTE — Progress Notes (Addendum)
Angelica Duncan is a 30 y.o. [phone] at 8485w0d by LMP admitted for SOL.   Subjective: Patient is very comfortable with epidural, no complaints and not feeling urge to push.   Objective: BP 131/69 mmHg  Pulse 110  Temp(Src) 98.4 F (36.9 C) (Oral)  Resp 16  Ht 5\' 6"  (1.676 m)  Wt 82.555 kg (182 lb)  BMI 29.39 kg/m2  SpO2 99%  LMP 03/22/2015 (Approximate)      FHT:  FHR: 130 bpm, variability: moderate,  accelerations:  Present,  decelerations:  Absent UC:   Moderate, q3-5 min SVE:   Dilation: 9 Effacement (%): 90 Station: -1 Exam by:: Angelica Duncan, [place] JYNW@AROM@ 0005, clear  Labs: Lab Results  Component Value Date   WBC 13.7* 12/16/2015   HGB 11.2* 12/16/2015   HCT 33.1* 12/16/2015   MCV 85.3 12/16/2015   PLT 252 12/16/2015    Assessment / Plan: Spontaneous labor, progressing normally  Labor: SOL, SVE: 9/90/-1. AROM @0005 , clear. Expectant management Fetal Wellbeing:  Category I Pain Control:  Epidural I/D:  GBS neg Anticipated MOD:  SVD  Angelica Duncan 12/17/2015, 12:54 AM   Angelica Duncan,Angelica Duncan 12/21/2015 1:44 PM

## 2015-12-17 NOTE — Anesthesia Postprocedure Evaluation (Signed)
Anesthesia Post Note  Patient: Angelica Duncan  Procedure(s) Performed: * No procedures listed *  Patient location during evaluation: Mother Baby Anesthesia Type: Epidural Level of consciousness: awake and alert and oriented Pain management: pain level controlled Vital Signs Assessment: post-procedure vital signs reviewed and stable Respiratory status: spontaneous breathing Cardiovascular status: blood pressure returned to baseline Postop Assessment: no headache, no backache, epidural receding, patient able to bend at knees, no signs of nausea or vomiting and adequate PO intake Anesthetic complications: no     Last Vitals:  Filed Vitals:   12/17/15 0440 12/17/15 0830  BP: 121/61 116/63  Pulse: 88 88  Temp: 36.9 C 36.6 C  Resp: 18 20    Last Pain:  Filed Vitals:   12/17/15 1001  PainSc: 0-No pain   Pain Goal: Patients Stated Pain Goal: 0 (12/16/15 1617)               [name]

## 2015-12-18 MED ORDER — FERROUS SULFATE 325 (65 FE) MG PO TABS: 325.0000 mg | ORAL_TABLET | Freq: Two times a day (BID) | ORAL | Status: AC

## 2015-12-18 MED ORDER — DOCUSATE SODIUM 100 MG PO CAPS: 100.0000 mg | ORAL_CAPSULE | Freq: Two times a day (BID) | ORAL | Status: AC

## 2015-12-18 MED ORDER — IBUPROFEN 600 MG PO TABS: 600.0000 mg | ORAL_TABLET | Freq: Four times a day (QID) | ORAL | Status: AC

## 2015-12-18 NOTE — Progress Notes (Signed)
Post Partum Day 1 Subjective:  Angelica Duncan is a 30 y.o. G4P4001 52w0ds/p SVD.  No acute events overnight.  Pt denies problems with ambulating, voiding or po intake.  She denies nausea or vomiting.  Pain is well-controlled.  She has had flatus. She has not had bowel movement.  Lochia moderate.  Plan for birth control is condoms.  Method of Feeding:  breast  Objective: Blood pressure 104/62, pulse 67, temperature 98.1 F (36.7 C), temperature source Oral, resp. rate 18, height '5\' 6"'$  (1.676 m), weight 82.555 kg (182 lb), last menstrual period 03/22/2015, SpO2 98 %, unknown if currently breastfeeding.  Physical Exam:  General: alert, cooperative and no distress Lochia:normal flow Chest: CTAB Heart: RRR no m/r/g Abdomen: +BS, soft, nontender Uterine Fundus: firm, below level of umbilicus DVT Evaluation: No evidence of DVT seen on physical exam. Extremities: No edema   Recent Labs  12/16/15 1827  HGB 11.2*  HCT 33.1*    Assessment/Plan:  ASSESSMENT: PROBYN GALATIis a 30y.o. G4P4001 366w0d/p SVD PLAN: Discharge home  MMR before d/c    LOS: 2 days   CaPage Spiro/13/2017, 7:11 AM   OB fellow attestation:  I have seen and examined this patient; I agree with above documentation in the medical student's note.  See Discharge summary for official documentation  KiCaren MacadamMD 10:08 AM

## 2015-12-18 NOTE — Discharge Summary (Signed)
OB Discharge Summary     Patient Name: Angelica Duncan DOB: [birth year] MRN: [id number]  Date of admission: 12/16/2015 Delivering MD: Jacklyn Shell   Date of discharge: 12/18/2015  Admitting diagnosis: 38w check baby status Intrauterine pregnancy: [redacted]w[redacted]d     Secondary diagnosis:  Active Problems:   Active labor at term   NSVD (normal spontaneous vaginal delivery)  Additional problems: None     Discharge diagnosis: Term Pregnancy Delivered                                                                                                Post partum procedures:none  Augmentation: NA  Complications: None  Hospital course:  Onset of Labor With Vaginal Delivery     30 y.o. yo G4P4001 at [redacted]w[redacted]d was admitted in Active Labor on 12/16/2015. Patient had an uncomplicated labor course as follows:  Membrane Rupture Time/Date: 12:05 AM ,12/17/2015   Intrapartum Procedures: Episiotomy: None [1]                                         Lacerations:  2nd degree [3]  Patient had a delivery of a Viable infant. 12/17/2015  Information for the patient's newborn:  Blanche, Scovell [[id number]]  Delivery Method: Vaginal, Spontaneous Delivery (Filed from Delivery Summary)    Pateint had an uncomplicated postpartum course.  She is ambulating, tolerating a regular diet, passing flatus, and urinating well. Patient is discharged home in stable condition on 12/18/2015.    Physical exam  Filed Vitals:   12/17/15 0830 12/17/15 1600 12/17/15 1833 12/18/15 0506  BP: 116/63 110/57 111/50 104/62  Pulse: 88 76 73 67  Temp: 97.8 F (36.6 C) 98 F (36.7 C) 97.8 F (36.6 C) 98.1 F (36.7 C)  TempSrc: Oral Oral Oral Oral  Resp: Height:      Weight:      SpO2: 98%  98%    General: alert, cooperative and no distress Lochia: appropriate Uterine Fundus: firm Incision: Healing well with no significant drainage, No significant erythema, Dressing is clean, dry, and intact DVT  Evaluation: No evidence of DVT seen on physical exam. Negative Homan's sign. No cords or calf tenderness. Labs: Lab Results  Component Value Date   WBC 13.7* 12/16/2015   HGB 11.2* 12/16/2015   HCT 33.1* 12/16/2015   MCV 85.3 12/16/2015   PLT 252 12/16/2015   CMP Latest Ref Rng 12/16/2015  Glucose 65 - 99 mg/dL 93  BUN 6 - 20 mg/dL 10  Creatinine 0.86 - 5.78 mg/dL 4.69  Sodium 629 - 528 mmol/L 134(L)  Potassium 3.5 - 5.1 mmol/L 3.8  Chloride 101 - 111 mmol/L 102  CO2 22 - 32 mmol/L 22  Calcium 8.9 - 10.3 mg/dL 4.1(L)  Total Protein 6.5 - 8.1 g/dL 6.8  Total Bilirubin 0.3 - 1.2 mg/dL 0.3  Alkaline Phos 38 - 126 U/L 184(H)  AST 15 - 41 U/L 19  ALT 14 - 54 U/L 11(L)  Discharge instruction: per After Visit Summary and "Baby and Me Booklet".  After visit meds:    Medication List    TAKE these medications        acetaminophen 325 MG tablet  Commonly known as:  TYLENOL  Take 650 mg by mouth every 6 (six) hours as needed for headache.     docusate sodium 100 MG capsule  Commonly known as:  COLACE  Take 1 capsule (100 mg total) by mouth 2 (two) times daily.     ferrous sulfate 325 (65 FE) MG tablet  Take 1 tablet (325 mg total) by mouth 2 (two) times daily with a meal.     ibuprofen 600 MG tablet  Commonly known as:  ADVIL,MOTRIN  Take 1 tablet (600 mg total) by mouth every 6 (six) hours.        Diet: routine diet  Activity: Advance as tolerated. Pelvic rest for 6 weeks.   [facility]  Postpartum contraception: undecided  Newborn Data: Live born female  Birth Weight: 7 lb 1.6 oz (3220 g) APGAR: 9, 9  Baby Feeding: Breast Disposition:home with mother   12/18/2015 Federico FlakeKimberly Niles Jocie Meroney, MD

## 2015-12-18 NOTE — Lactation Note (Signed)
This note was copied from a baby's chart. Lactation Consultation Note  Follow up visit made.  Mom states baby cluster fed during the night.  C/o nipple soreness.  Baby is currently nursing with a shallow latch. Mom holding baby in cradle hold.   Baby taken off and reviewed both cross cradle and football hold.  Baby was able to obtain deeper latch and mom states feeding more comfortable.  Reviewed keeping baby close during feeding and alternate breast massage.  Instructed on engorgement treatment.  Lactation outpatient services reviewed and encouraged prn.  Patient Name: Angelica Jerrye Nobleamela Duncan Angelica Duncan Date: 12/18/2015 Reason for consult: Follow-up assessment;Breast/nipple pain   Maternal Data    Feeding Feeding Type: Breast Fed Length of feed: 20 min  LATCH Score/Interventions Latch: Grasps breast easily, tongue down, lips flanged, rhythmical sucking.  Audible Swallowing: A few with stimulation Intervention(s): Skin to skin;Hand expression;Alternate breast massage  Type of Nipple: Everted at rest and after stimulation  Comfort (Breast/Nipple): Filling, red/small blisters or bruises, mild/mod discomfort  Problem noted: Mild/Moderate discomfort;Cracked, bleeding, blisters, bruises  Hold (Positioning): Assistance needed to correctly position infant at breast and maintain latch. Intervention(s): Breastfeeding basics reviewed;Support Pillows;Position options;Skin to skin  LATCH Score: 7  Lactation Tools Discussed/Used     Consult Status Consult Status: Complete    Huston FoleyMOULDEN, Andruw Battie S 12/18/2015, 9:29 AM
